# Patient Record
Sex: Female | Born: 1975 | Race: White | Hispanic: No | Marital: Married | Smoking: Never smoker
Health system: Southern US, Community
[De-identification: ages and names within clinical notes are randomized; demographics above are authoritative.]

## PROBLEM LIST (undated history)

## (undated) HISTORY — PX: KNEE SURGERY: SHX244

## (undated) HISTORY — PX: CHOLECYSTECTOMY: SHX55

---

## 2014-08-06 HISTORY — PX: TRIGGER FINGER RELEASE: SHX641

## 2014-09-26 ENCOUNTER — Encounter (HOSPITAL_COMMUNITY)
Admission: AD | Disposition: A | Discharge status: Home / Self Care | Payer: Self-pay | Source: Ambulatory Visit | Attending: Orthopedic Surgery

## 2014-09-26 ENCOUNTER — Inpatient Hospital Stay (HOSPITAL_COMMUNITY): Payer: BC Managed Care – PPO | Admitting: Anesthesiology

## 2014-09-26 ENCOUNTER — Other Ambulatory Visit: Payer: Self-pay | Admitting: Orthopedic Surgery

## 2014-09-26 ENCOUNTER — Ambulatory Visit (HOSPITAL_COMMUNITY)
Admission: AD | Admit: 2014-09-26 | Discharge: 2014-09-26 | Disposition: A | Discharge status: Home / Self Care | Payer: BC Managed Care – PPO | Source: Ambulatory Visit | Attending: Orthopedic Surgery | Admitting: Orthopedic Surgery

## 2014-09-26 ENCOUNTER — Encounter (HOSPITAL_COMMUNITY): Payer: Self-pay | Admitting: *Deleted

## 2014-09-26 DIAGNOSIS — L02511 Cutaneous abscess of right hand: Secondary | ICD-10-CM | POA: Diagnosis not present

## 2014-09-26 HISTORY — PX: I & D EXTREMITY: SHX5045

## 2014-09-26 LAB — HCG, SERUM, QUALITATIVE: Preg, Serum: NEGATIVE

## 2014-09-26 SURGERY — IRRIGATION AND DEBRIDEMENT EXTREMITY
Anesthesia: Monitor Anesthesia Care | Site: Hand | Laterality: Right

## 2014-09-26 MED ORDER — LACTATED RINGERS IV SOLN
INTRAVENOUS | Status: DC
  Administered 2014-09-26: 18:00:00 via INTRAVENOUS

## 2014-09-26 MED ORDER — DIPHENHYDRAMINE HCL 50 MG/ML IJ SOLN
12.5000 mg | Freq: Once | INTRAMUSCULAR | Status: AC
  Administered 2014-09-26: 12.5 mg via INTRAVENOUS

## 2014-09-26 MED ORDER — DIPHENHYDRAMINE HCL 50 MG/ML IJ SOLN
INTRAMUSCULAR | Status: AC
Start: 2014-09-26 — End: 2014-09-27
  Filled 2014-09-26: qty 1

## 2014-09-26 MED ORDER — VANCOMYCIN HCL IN DEXTROSE 1-5 GM/200ML-% IV SOLN
1000.0000 mg | INTRAVENOUS | Status: AC
  Administered 2014-09-26: 1000 mg via INTRAVENOUS
  Filled 2014-09-26 (×2): qty 200

## 2014-09-26 MED ORDER — PROPOFOL INFUSION 10 MG/ML OPTIME
INTRAVENOUS | Status: DC | PRN
  Administered 2014-09-26: 75 ug/kg/min via INTRAVENOUS

## 2014-09-26 MED ORDER — LIDOCAINE HCL (CARDIAC) 20 MG/ML IV SOLN
INTRAVENOUS | Status: DC | PRN
  Administered 2014-09-26: 20 mg via INTRAVENOUS

## 2014-09-26 MED ORDER — FENTANYL CITRATE 0.05 MG/ML IJ SOLN
INTRAMUSCULAR | Status: AC
  Filled 2014-09-26: qty 5

## 2014-09-26 MED ORDER — PROPOFOL 10 MG/ML IV BOLUS
INTRAVENOUS | Status: DC | PRN
  Administered 2014-09-26 (×2): 20 mg via INTRAVENOUS

## 2014-09-26 MED ORDER — PROPOFOL 10 MG/ML IV BOLUS
INTRAVENOUS | Status: AC
  Filled 2014-09-26: qty 20

## 2014-09-26 MED ORDER — LACTATED RINGERS IV SOLN
INTRAVENOUS | Status: DC | PRN
  Administered 2014-09-26 (×2): via INTRAVENOUS

## 2014-09-26 MED ORDER — OXYCODONE-ACETAMINOPHEN 5-325 MG PO TABS: ORAL_TABLET | ORAL | Status: AC

## 2014-09-26 MED ORDER — BUPIVACAINE-EPINEPHRINE (PF) 0.5% -1:200000 IJ SOLN
INTRAMUSCULAR | Status: DC | PRN
  Administered 2014-09-26: 30 mL via PERINEURAL

## 2014-09-26 MED ORDER — MIDAZOLAM HCL 2 MG/2ML IJ SOLN
INTRAMUSCULAR | Status: AC
  Filled 2014-09-26: qty 2

## 2014-09-26 MED ORDER — DIPHENHYDRAMINE HCL 25 MG PO CAPS
25.0000 mg | ORAL_CAPSULE | Freq: Once | ORAL | Status: AC
  Administered 2014-09-26: 25 mg via ORAL
  Filled 2014-09-26: qty 1

## 2014-09-26 MED ORDER — SODIUM CHLORIDE 0.9 % IR SOLN
Status: DC | PRN
  Administered 2014-09-26: 1000 mL

## 2014-09-26 MED ORDER — MIDAZOLAM HCL 5 MG/5ML IJ SOLN
INTRAMUSCULAR | Status: DC | PRN
  Administered 2014-09-26: 2 mg via INTRAVENOUS

## 2014-09-26 MED ORDER — SULFAMETHOXAZOLE-TRIMETHOPRIM 800-160 MG PO TABS: 1.0000 | ORAL_TABLET | Freq: Two times a day (BID) | ORAL | Status: AC

## 2014-09-26 SURGICAL SUPPLY — 56 items
BANDAGE COBAN STERILE 2 (GAUZE/BANDAGES/DRESSINGS) IMPLANT
BANDAGE ELASTIC 3 VELCRO ST LF (GAUZE/BANDAGES/DRESSINGS) ×3 IMPLANT
BANDAGE ELASTIC 4 VELCRO ST LF (GAUZE/BANDAGES/DRESSINGS) IMPLANT
BNDG COHESIVE 1X5 TAN STRL LF (GAUZE/BANDAGES/DRESSINGS) IMPLANT
BNDG CONFORM 2 STRL LF (GAUZE/BANDAGES/DRESSINGS) IMPLANT
BNDG ESMARK 4X9 LF (GAUZE/BANDAGES/DRESSINGS) ×3 IMPLANT
BNDG GAUZE ELAST 4 BULKY (GAUZE/BANDAGES/DRESSINGS) ×3 IMPLANT
CORDS BIPOLAR (ELECTRODE) ×3 IMPLANT
COVER SURGICAL LIGHT HANDLE (MISCELLANEOUS) ×3 IMPLANT
CUFF TOURNIQUET SINGLE 18IN (TOURNIQUET CUFF) ×3 IMPLANT
DECANTER SPIKE VIAL GLASS SM (MISCELLANEOUS) ×3 IMPLANT
DRAIN PENROSE 1/4X12 LTX STRL (WOUND CARE) IMPLANT
DRSG ADAPTIC 3X8 NADH LF (GAUZE/BANDAGES/DRESSINGS) IMPLANT
DRSG EMULSION OIL 3X3 NADH (GAUZE/BANDAGES/DRESSINGS) IMPLANT
DRSG PAD ABDOMINAL 8X10 ST (GAUZE/BANDAGES/DRESSINGS) ×6 IMPLANT
GAUZE PACKING IODOFORM 1/4X15 (GAUZE/BANDAGES/DRESSINGS) ×3 IMPLANT
GAUZE SPONGE 4X4 12PLY STRL (GAUZE/BANDAGES/DRESSINGS) ×3 IMPLANT
GAUZE XEROFORM 1X8 LF (GAUZE/BANDAGES/DRESSINGS) IMPLANT
GLOVE BIO SURGEON STRL SZ7.5 (GLOVE) ×3 IMPLANT
GLOVE BIOGEL PI IND STRL 8 (GLOVE) IMPLANT
GLOVE BIOGEL PI INDICATOR 8 (GLOVE)
GOWN STRL REIN XL XLG (GOWN DISPOSABLE) ×3 IMPLANT
HANDPIECE INTERPULSE COAX TIP (DISPOSABLE)
KIT BASIN OR (CUSTOM PROCEDURE TRAY) ×3 IMPLANT
KIT ROOM TURNOVER OR (KITS) ×3 IMPLANT
LOOP VESSEL MAXI BLUE (MISCELLANEOUS) IMPLANT
LOOP VESSEL MINI RED (MISCELLANEOUS) IMPLANT
MANIFOLD NEPTUNE II (INSTRUMENTS) ×3 IMPLANT
NEEDLE HYPO 25X1 1.5 SAFETY (NEEDLE) IMPLANT
NS IRRIG 1000ML POUR BTL (IV SOLUTION) ×3 IMPLANT
PACK ORTHO EXTREMITY (CUSTOM PROCEDURE TRAY) ×3 IMPLANT
PAD ARMBOARD 7.5X6 YLW CONV (MISCELLANEOUS) ×6 IMPLANT
PAD CAST 3X4 CTTN HI CHSV (CAST SUPPLIES) ×1 IMPLANT
PADDING CAST COTTON 3X4 STRL (CAST SUPPLIES) ×2
SCRUB BETADINE 4OZ XXX (MISCELLANEOUS) ×3 IMPLANT
SET HNDPC FAN SPRY TIP SCT (DISPOSABLE) IMPLANT
SOLUTION BETADINE 4OZ (MISCELLANEOUS) ×3 IMPLANT
SPLINT PLASTER CAST XFAST 3X15 (CAST SUPPLIES) ×1 IMPLANT
SPLINT PLASTER XTRA FASTSET 3X (CAST SUPPLIES) ×2
SPONGE GAUZE 4X4 12PLY STER LF (GAUZE/BANDAGES/DRESSINGS) ×3 IMPLANT
SPONGE LAP 18X18 X RAY DECT (DISPOSABLE) ×3 IMPLANT
SPONGE LAP 4X18 X RAY DECT (DISPOSABLE) ×3 IMPLANT
SUCTION FRAZIER TIP 10 FR DISP (SUCTIONS) ×3 IMPLANT
SUT ETHILON 4 0 PS 2 18 (SUTURE) ×3 IMPLANT
SUT MON AB 5-0 P3 18 (SUTURE) IMPLANT
SYR CONTROL 10ML LL (SYRINGE) IMPLANT
TOWEL OR 17X24 6PK STRL BLUE (TOWEL DISPOSABLE) ×3 IMPLANT
TOWEL OR 17X26 10 PK STRL BLUE (TOWEL DISPOSABLE) ×3 IMPLANT
TUBE ANAEROBIC SPECIMEN COL (MISCELLANEOUS) ×3 IMPLANT
TUBE CONNECTING 12'X1/4 (SUCTIONS) ×1
TUBE CONNECTING 12X1/4 (SUCTIONS) ×2 IMPLANT
TUBE FEEDING 5FR 15 INCH (TUBING) ×3 IMPLANT
TUBING CYSTO DISP (UROLOGICAL SUPPLIES) ×3 IMPLANT
UNDERPAD 30X30 INCONTINENT (UNDERPADS AND DIAPERS) ×3 IMPLANT
WATER STERILE IRR 1000ML POUR (IV SOLUTION) ×3 IMPLANT
YANKAUER SUCT BULB TIP NO VENT (SUCTIONS) IMPLANT

## 2014-09-26 NOTE — Anesthesia Procedure Notes (Signed)
Anesthesia Regional Block:  Supraclavicular block  Pre-Anesthetic Checklist: ,, timeout performed, Correct Patient, Correct Site, Correct Laterality, Correct Procedure, Correct Position, site marked, Risks and benefits discussed,  Surgical consent,  Pre-op evaluation,  At surgeon's request and post-op pain management  Laterality: Right  Prep: chloraprep       Needles:  Injection technique: Single-shot  Needle Type: Echogenic Stimulator Needle     Needle Length: 5cm 5 cm Needle Gauge: 22 and 22 G    Additional Needles:  Procedures: ultrasound guided (picture in chart) and nerve stimulator Supraclavicular block  Nerve Stimulator or Paresthesia:  Response: bicep contraction, 0.45 mA,   Additional Responses:   Narrative:  Start time: 09/26/2014 7:21 PM End time: 09/26/2014 7:30 PM Injection made incrementally with aspirations every 5 mL.  Performed by: Personally  Anesthesiologist: Heather RobertsSINGER, Jaspal Pultz  Additional Notes: Functioning IV was confirmed and monitors applied.  A 50mm 22ga echogenic arrow stimulator was used. Sterile prep and drape,hand hygiene and sterile gloves were used.Ultrasound guidance: relevant anatomy identified, needle position confirmed, local anesthetic spread visualized around nerve(s)., vascular puncture avoided.  Image printed for medical record.  Negative aspiration and negative test dose prior to incremental administration of local anesthetic. The patient tolerated the procedure well.

## 2014-09-26 NOTE — Transfer of Care (Signed)
Immediate Anesthesia Transfer of Care Note  Patient: Kayla Montgomery  Procedure(s) Performed: Procedure(s): IRRIGATION AND DEBRIDEMENT right thumb and wrist (Right)  Patient Location: PACU  Anesthesia Type:MAC and Regional  Level of Consciousness: awake, alert  and oriented  Airway & Oxygen Therapy: Patient Spontanous Breathing  Post-op Assessment: Report given to PACU RN and Post -op Vital signs reviewed and stable  Post vital signs: Reviewed and stable  Complications: No apparent anesthesia complications

## 2014-09-26 NOTE — Anesthesia Preprocedure Evaluation (Addendum)
Anesthesia Evaluation  Patient identified by MRN, date of birth, ID band Patient awake    Reviewed: Allergy & Precautions, H&P , NPO status , Patient's Chart, lab work & pertinent test results  Airway Mallampati: II  TM Distance: >3 FB Neck ROM: full    Dental  (+) Teeth Intact, Dental Advidsory Given   Pulmonary neg pulmonary ROS,  breath sounds clear to auscultation        Cardiovascular negative cardio ROS  Rhythm:regular Rate:Normal     Neuro/Psych negative neurological ROS  negative psych ROS   GI/Hepatic negative GI ROS, Neg liver ROS,   Endo/Other  negative endocrine ROS  Renal/GU negative Renal ROS     Musculoskeletal   Abdominal   Peds  Hematology   Anesthesia Other Findings   Reproductive/Obstetrics negative OB ROS                             Anesthesia Physical Anesthesia Plan  ASA: I and emergent  Anesthesia Plan: MAC and Regional   Post-op Pain Management:    Induction:   Airway Management Planned: Simple Face Mask  Additional Equipment:   Intra-op Plan:   Post-operative Plan:   Informed Consent: I have reviewed the patients History and Physical, chart, labs and discussed the procedure including the risks, benefits and alternatives for the proposed anesthesia with the patient or authorized representative who has indicated his/her understanding and acceptance.   Dental Advisory Given  Plan Discussed with: Anesthesiologist, CRNA and Surgeon  Anesthesia Plan Comments:        Anesthesia Quick Evaluation

## 2014-09-26 NOTE — Anesthesia Postprocedure Evaluation (Signed)
Anesthesia Post Note  Patient: Kayla QuartoJennifer Mifflin  Procedure(s) Performed: Procedure(s) (LRB): IRRIGATION AND DEBRIDEMENT right thumb and wrist (Right)  Anesthesia type: MAC  Patient location: PACU  Post pain: Pain level controlled  Post assessment: Patient's Cardiovascular Status Stable  Last Vitals:  Filed Vitals:   09/26/14 2130  BP: 106/58  Pulse: 66  Temp:   Resp: 20    Post vital signs: Reviewed and stable  Level of consciousness: sedated  Complications: No apparent anesthesia complications

## 2014-09-26 NOTE — Progress Notes (Signed)
Report to D. Rees RN as primary caregiver 

## 2014-09-26 NOTE — H&P (Signed)
  Kayla QuartoJennifer Montgomery is an 38 y.o. female.   Chief Complaint: right thumb infection HPI: 38 yo rhd female underwent right thumb trigger release 4 weeks ago in [place].  For last five days has had pain and swelling in right thumb.  No fevers, chills, night sweats.  Started on Bactrim by treating service in [place].  Continues to have draining wound at surgical site.  Has noted pain in wrist.  No past medical history on file.  No past surgical history on file.  No family history on file. Social History:  has no tobacco, alcohol, and drug history on file.  Allergies: Allergies not on file  No prescriptions prior to admission    No results found for this or any previous visit (from the past 48 hour(s)).  No results found.   A comprehensive review of systems was negative except for: Neurological: positive for headaches  There were no vitals taken for this visit.  General appearance: alert, cooperative and appears stated age Head: Normocephalic, without obvious abnormality, atraumatic Neck: supple, symmetrical, trachea midline Resp: clear to auscultation bilaterally Cardio: regular rate and rhythm GI: non tender Extremities: intact sensation and capillary refill all digits.  +epl/fpl/io.  right thumb swollen, mildly ruborous.  draining at proximal flexion crease.  mildly swollen at wrist.  no proximal streaking. Pulses: 2+ and symmetric Skin: Skin color, texture, turgor normal. No rashes or lesions Neurologic: Grossly normal Incision/Wound: As above  Assessment/Plan Right thumb infection, possible tendon sheath/bursa involvement.  Recommend OR for incision and drainage tendon sheath.  Risks, benefits, and alternatives of surgery were discussed and the patient agrees with the plan of care.   Kiahna Banghart R 09/26/2014, 5:14 PM

## 2014-09-26 NOTE — Progress Notes (Signed)
Orthopedic Tech Progress Note Patient Details:  Kayla QuartoJennifer Montgomery 01-31-1976 [id number]  Ortho Devices Type of Ortho Device: Arm sling Ortho Device/Splint Location: RUE Ortho Device/Splint Interventions: Ordered, Application   Jennye MoccasinHughes, Daphene Chisholm Craig 09/26/2014, 10:50 PM

## 2014-09-26 NOTE — Op Note (Signed)
[id number] 

## 2014-09-26 NOTE — Progress Notes (Signed)
C/o itching from IV Vanc & requesting IV Benadryl. Dr Krista BlueSinger updated-new ord for Benadryl. Will cont to monitor as Vanc cont to infuse.

## 2014-09-26 NOTE — Brief Op Note (Signed)
09/26/2014  9:02 PM  PATIENT:  Jack QuartoJennifer Pinney  38 y.o. female  PRE-OPERATIVE DIAGNOSIS:  Right Hand Infection  POST-OPERATIVE DIAGNOSIS:  Right Hand Infection  PROCEDURE:  Right thumb incision and drainage flexor sheath/bursa  SURGEON:  Surgeon(s) and Role:    * Betha LoaKevin Latanja Lehenbauer, MD - Primary  PHYSICIAN ASSISTANT:   ASSISTANTS: none   ANESTHESIA:   regional  EBL:  Total I/O In: 900 [I.V.:900] Out: -   BLOOD ADMINISTERED:none  DRAINS: iodoform packing  LOCAL MEDICATIONS USED:  NONE  SPECIMEN:  Source of Specimen:  right thumb  DISPOSITION OF SPECIMEN:  micro  COUNTS:  YES  TOURNIQUET:   Total Tourniquet Time Documented: Upper Arm (Right) - 28 minutes Total: Upper Arm (Right) - 28 minutes   DICTATION: .Other Dictation: Dictation Number [id number]  PLAN OF CARE: Discharge to home after PACU  PATIENT DISPOSITION:  PACU - hemodynamically stable.

## 2014-09-26 NOTE — Discharge Instructions (Signed)

## 2014-09-27 ENCOUNTER — Encounter (HOSPITAL_COMMUNITY): Payer: Self-pay | Admitting: Orthopedic Surgery

## 2014-09-27 NOTE — Op Note (Signed)
NAMJack Quarto:  Ludlam, Keiera           ACCOUNT NO.:  [id number]  MEDICAL RECORD NO.:  [id number]  LOCATION:  [facility]                         FACILITY:  [facility]  PHYSICIAN:  Betha LoaKevin Valetta Mulroy, MD        DATE OF BIRTH:  1976-05-16  DATE OF PROCEDURE:  09/26/2014 DATE OF DISCHARGE:                              OPERATIVE REPORT   PREOPERATIVE DIAGNOSIS:  Right thumb flexor sheath infection status post trigger digit release.  POSTOPERATIVE DIAGNOSIS:  Right thumb flexor sheath infection status post trigger digit release.  PROCEDURE:  Incision and drainage right thumb flexor sheath and bursa.  SURGEON:  Betha LoaKevin Thuan Tippett, MD  ASSISTANT:  None.  ANESTHESIA:  Regional with sedation.  IV FLUIDS:  Per anesthesia flow sheet.  ESTIMATED BLOOD LOSS:  Minimal.  COMPLICATIONS:  None.  SPECIMENS:  Cultures to micro.  DISPOSITION:  Stable to PACU.  PREOP INDICATIONS:  Ms. Alla FeelingChampion is a 38 year old female who approximately 4 weeks ago underwent right trigger thumb release in [place].  She states over the past 5 days or so she has had increasing pain, swelling, and drainage of the thumb.  No fevers, chills, or night sweats.  She presented to the office today for evaluation.  On examination, she had a draining wound at the volar aspect of the MP flexion crease of the thumb.  I recommended incision and drainage of the thumb and flexor sheath.  Risks, benefits, and alternatives of surgery were discussed including risk of blood loss, infection, damage to nerves, vessels, tendons, ligaments, bone, failure of surgery, need for additional surgery, complications with wound healing, continued pain, continued infection, the need for repeat irrigation and debridement. She voiced understanding of these risks and elected to proceed.  OPERATIVE COURSE:  After being identified preoperatively by myself, the patient and I agreed upon procedure and site of procedure.  Surgical site was marked.  The risks, benefits, and  alternatives of surgery were reviewed and she wished to proceed.  Surgical consent had been signed. Antibiotics were held for cultures.  She was transferred to the operating room and placed on the operating room table in supine position with the right upper extremity on arm board.  Regional block has been performed by Anesthesia in the preoperative holding.  She was given IV sedation in the operating room.  Right upper extremity was prepped and draped in normal sterile orthopedic fashion.  A surgical pause was performed between surgeons, anesthesia, operating room staff, and all were in agreement as to the patient, procedure, and site of procedure. Tourniquet at the proximal aspect of the extremity was inflated to 250 mmHg after exsanguination of the limb with an Esmarch bandage.  Incision was made at the volar aspect of the MP flexion crease of the thumb. This was carried to the deeper tissues by spreading technique.  The incision was made through the skin only.  There was significant cloudy fluid but no gross purulence.  Cultures were taken.  Another incision was made at the wrist over the FCR tendon.  This was carried past the FCR tendon protecting the radial artery.  The FPL tendon was identified and the bursa opened.  A #5 pediatric feeding tube was placed into the  tendon sheath from the distal wound.  Copious irrigation was placed through the tendon sheath.  Good effluent was obtained from both proximal and distal wounds.  The feeding tube was then placed distally from the distal wound to irrigate the distal aspect of the tendon sheath.  Again, good effluent was obtained.  The effluent was clear. The wounds were packed open with quarter-inch iodoform gauze.  It was felt that a drain left in the sheath was not necessary.  The wounds were dressed with sterile 4x4s and wrapped with a Kerlix bandage.  A thumb spica splint was placed and wrapped with Kerlix and Ace bandage. Tourniquet  was deflated at 28 minutes.  Fingertips were pink with brisk capillary refill after deflation of tourniquet.  Operative drapes were broken down.  The patient was awoken from anesthesia safely.  She was transferred back to the stretcher and taken to PACU in stable condition. She was given IV vancomycin after cultures had been taken.  I will see her back in the office next week.  In three days, she will take down the dressing and remove the packing and start daily hypertonic saline soak rinses.  We will give her Percocet 5/325, 1-2 p.o. q.6 hours p.r.n. pain, dispensed #40, and Bactrim DS 1 p.o. b.i.d. x7 more days.  She is currently on Bactrim and we will start her new prescription after this is finished.     Betha LoaKevin Lilia Letterman, MD     KK/MEDQ  D:  09/26/2014  T:  09/27/2014  Job:  [id number]

## 2014-09-30 LAB — CULTURE, ROUTINE-ABSCESS: Culture: NO GROWTH

## 2014-10-01 LAB — ANAEROBIC CULTURE

## 2014-11-22 ENCOUNTER — Ambulatory Visit (HOSPITAL_BASED_OUTPATIENT_CLINIC_OR_DEPARTMENT_OTHER)
Admission: RE | Admit: 2014-11-22 | Discharge: 2014-11-22 | Disposition: A | Discharge status: Home / Self Care | Payer: BLUE CROSS/BLUE SHIELD | Source: Ambulatory Visit | Attending: Family Medicine | Admitting: Family Medicine

## 2014-11-22 ENCOUNTER — Other Ambulatory Visit (HOSPITAL_BASED_OUTPATIENT_CLINIC_OR_DEPARTMENT_OTHER): Payer: Self-pay | Admitting: Family Medicine

## 2014-11-22 DIAGNOSIS — Z9049 Acquired absence of other specified parts of digestive tract: Secondary | ICD-10-CM | POA: Diagnosis not present

## 2014-11-22 DIAGNOSIS — R1031 Right lower quadrant pain: Secondary | ICD-10-CM | POA: Diagnosis not present

## 2014-11-22 DIAGNOSIS — R1033 Periumbilical pain: Secondary | ICD-10-CM | POA: Insufficient documentation

## 2014-11-22 MED ORDER — IOHEXOL 300 MG/ML  SOLN
100.0000 mL | Freq: Once | INTRAMUSCULAR | Status: AC | PRN
  Administered 2014-11-22: 100 mL via INTRAVENOUS

## 2016-06-06 IMAGING — CT CT ABD-PELV W/ CM
2 of 4 series · 16 of 46 positions shown, 18 images · IV contrast (APPLIED)
Comparison: None.

CLINICAL DATA: Periumbilical pain, right lower quadrant pain
starting last night, status postcholecystectomy, possible
appendicitis

EXAM:
CT ABDOMEN AND PELVIS WITH CONTRAST
TECHNIQUE: Multidetector CT imaging of the abdomen and pelvis was performed
using the standard protocol following bolus administration of
intravenous contrast.
CONTRAST:  100mL OMNIPAQUE IOHEXOL 300 MG/ML  SOLN

[Series 2: abd/pelvis 5.0 b31f · axial · 0.65mm/px · z∈[-376,+14]mm · 13 of 86 slices shown, 15 images]
[im 4/86  soft-tissue]
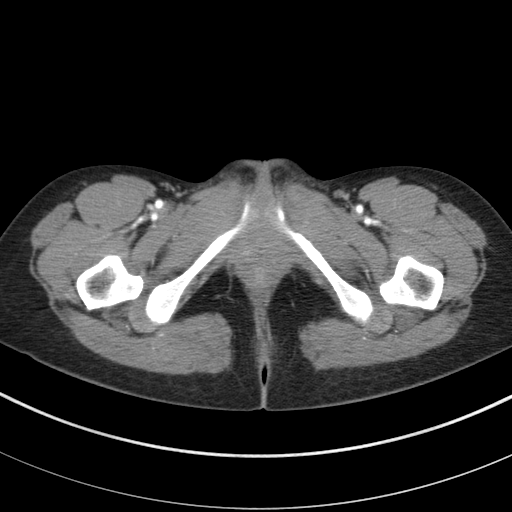
[im 4/86  bone]
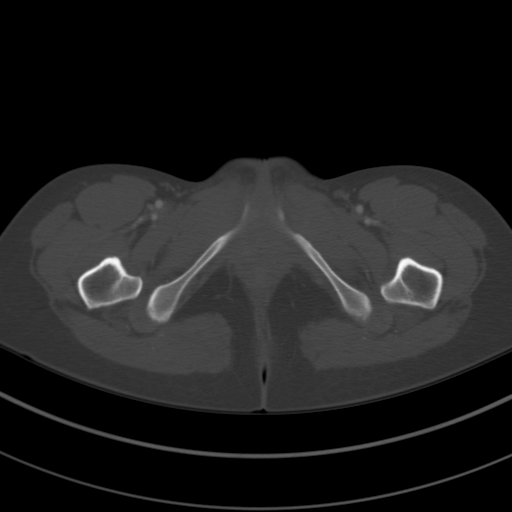
[im 11/86  soft-tissue]
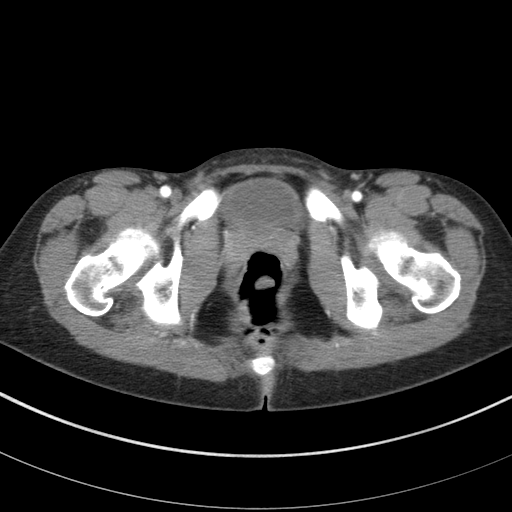
[im 18/86  soft-tissue]
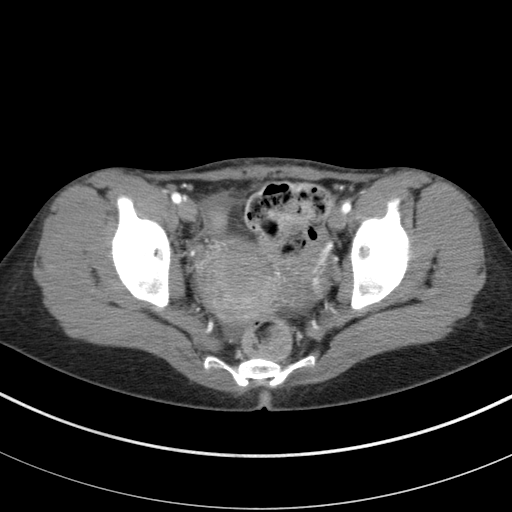
[im 24/86  soft-tissue]
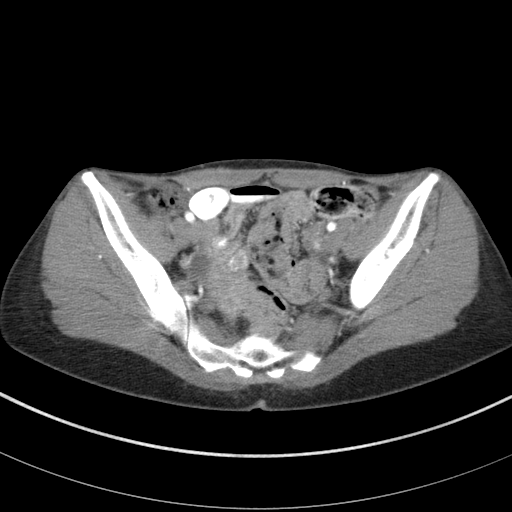
[im 31/86  soft-tissue]
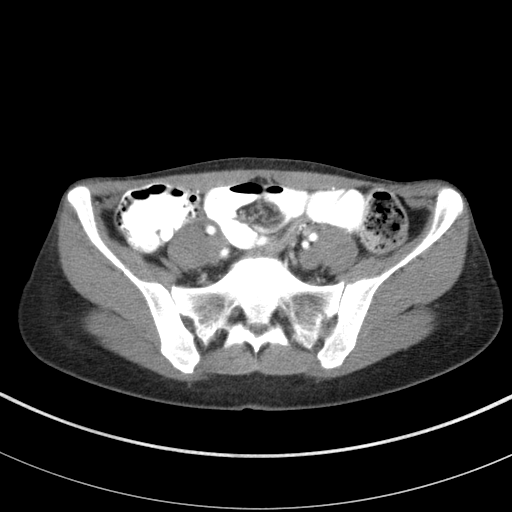
[im 38/86  soft-tissue]
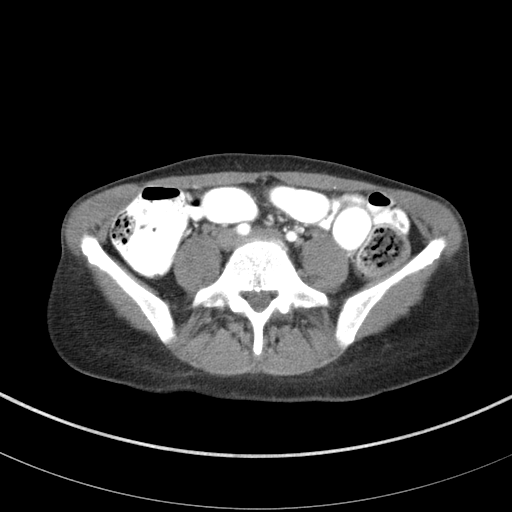
[im 45/86  soft-tissue]
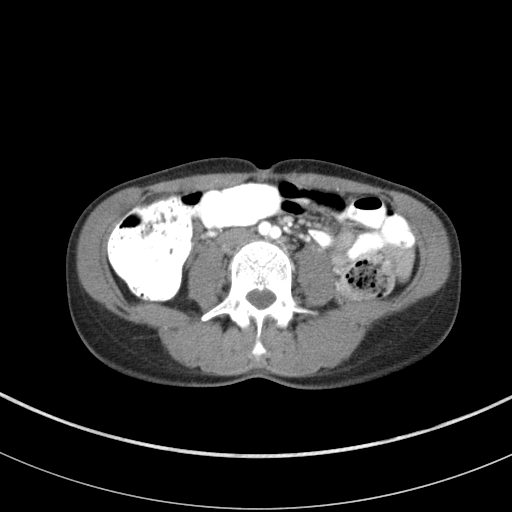
[im 48/86  soft-tissue]
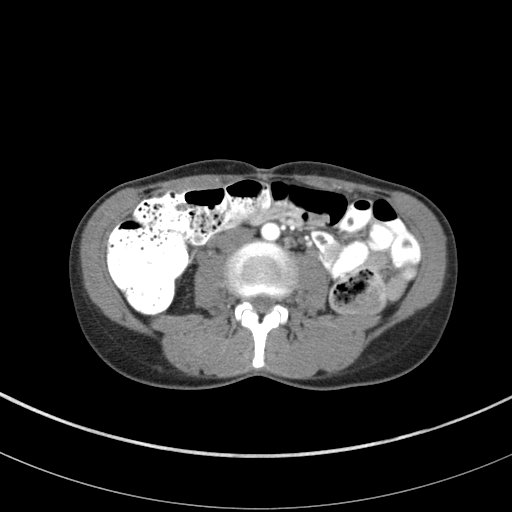
[im 55/86  soft-tissue]
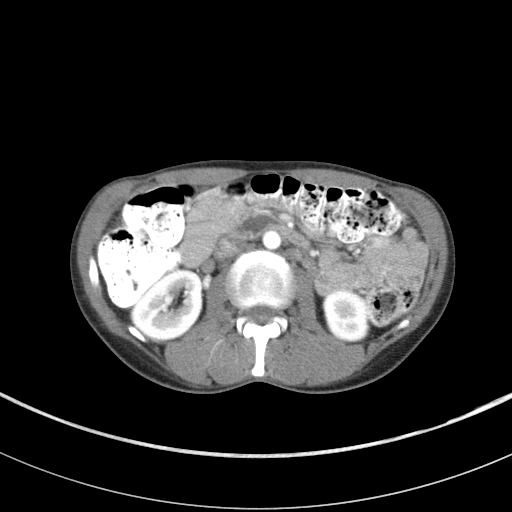
[im 55/86  bone]
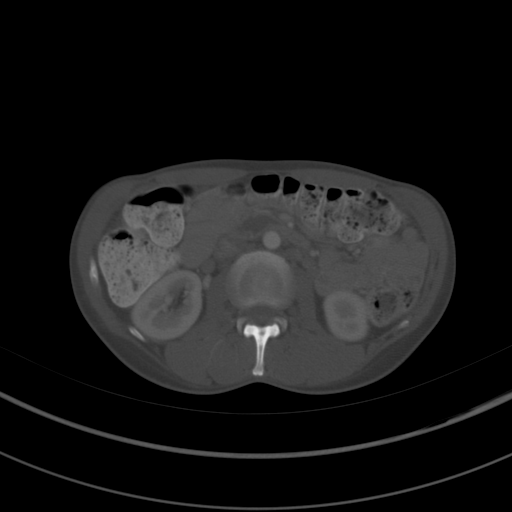
[im 62/86  soft-tissue]
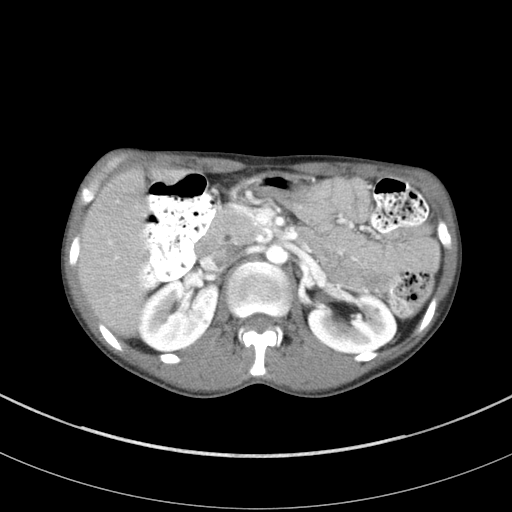
[im 69/86  soft-tissue]
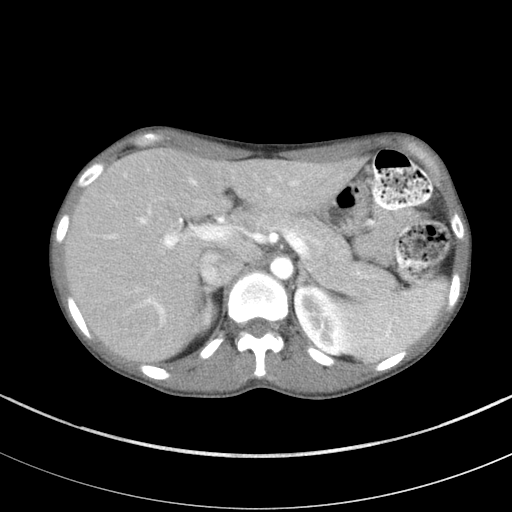
[im 75/86  soft-tissue]
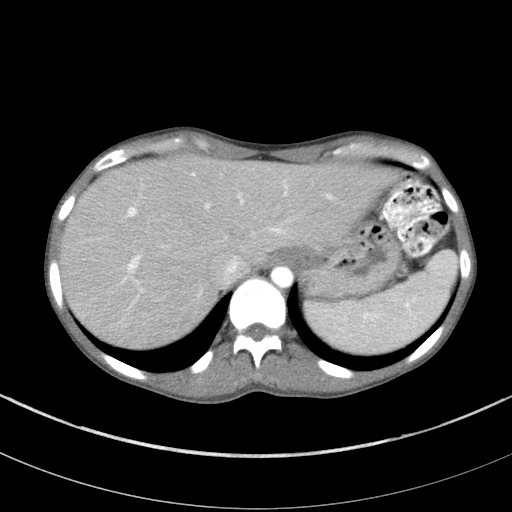
[im 82/86  soft-tissue]
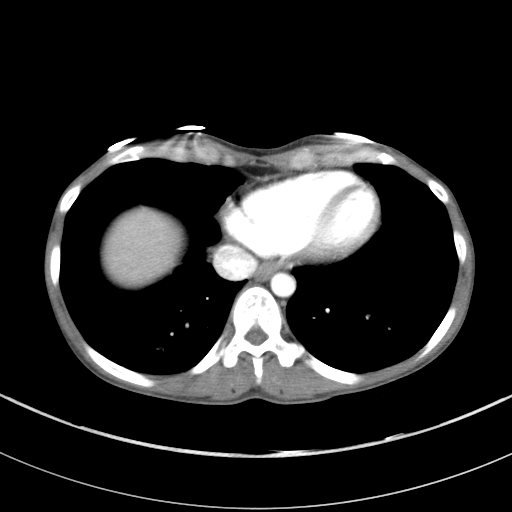

[Series 5: abd/pelvis 3.0 coronal · coronal · 0.66mm/px · 3 of 62 slices shown]
[im 21/62  soft-tissue]
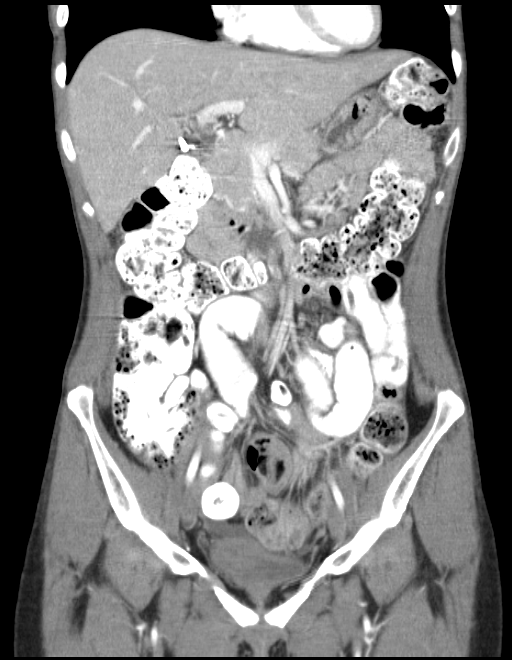
[im 28/62  soft-tissue]
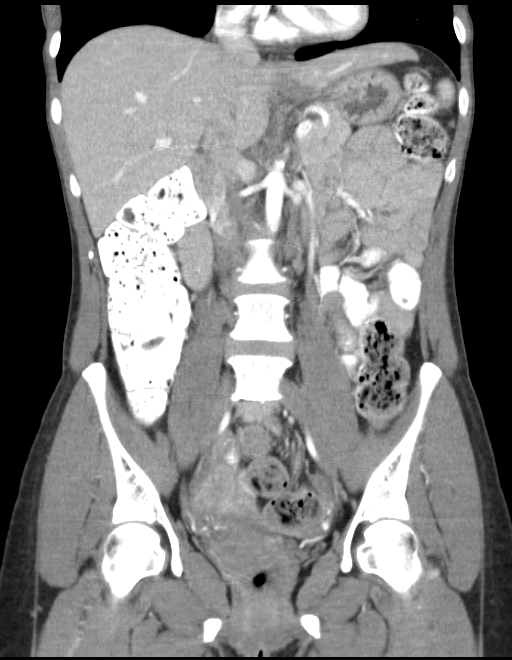
[im 34/62  soft-tissue]
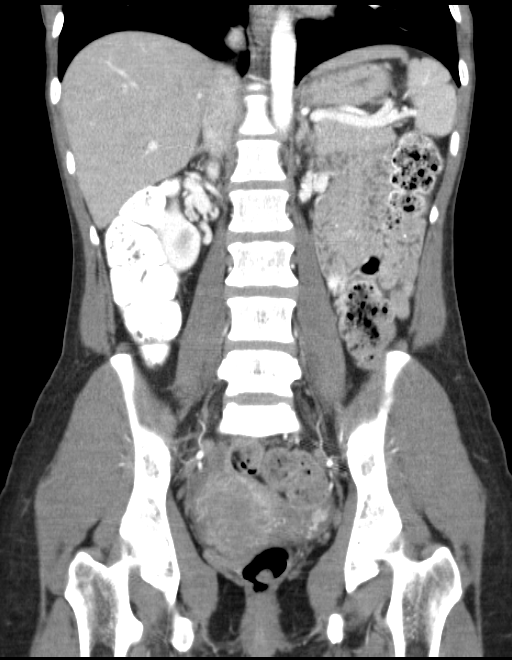

[16 of 46 positions shown; findings below may reference images not displayed]

FINDINGS: Sagittal images of the spine are unremarkable.

Enhanced liver, pancreas, spleen and adrenal glands are
unremarkable. Abdominal aorta is unremarkable. The patient is status
postcholecystectomy.

Enhanced kidneys are symmetrical in size. No hydronephrosis or
hydroureter.

No aortic aneurysm.  No focal renal mass.

Abundant stool noted throughout the colon. Abundant stool and
contrast material noted within cecum. There is no pericecal
inflammation. Normal appendix is partially visualized in axial image
52. The terminal ileum is unremarkable. No small bowel obstruction.
Small amount of free fluid noted within posterior cul-de-sac. The
uterus is retroflexed. No adnexal masses noted. Small to moderate
stool noted in distal sigmoid colon and rectum. There is a small
follicle in right ovary measures 1.2 cm.
IMPRESSION: 1. No pericecal inflammation. Normal appendix partially visualized.
Abundant stool and contrast noted within cecum. Abundant stool noted
throughout the colon.
2. No small bowel obstruction.
3. No hydronephrosis or hydroureter.
4. Retroflexed uterus. No adnexal mass. Small amount of pelvic free
fluid. Small follicle in right ovary measures 1.2 cm.
These results were called by telephone at the time of interpretation
on 11/22/2014 at [DATE] to Dr. HANSOO TRIEU , who verbally
acknowledged these results.
